# Patient Record
Sex: Male | Born: 1980 | Race: White | Hispanic: Yes | Marital: Single | Smoking: Never smoker
Health system: Southern US, Community
[De-identification: ages and names within clinical notes are randomized; demographics above are authoritative.]

## PROBLEM LIST (undated history)

## (undated) DIAGNOSIS — K219 Gastro-esophageal reflux disease without esophagitis: Secondary | ICD-10-CM

## (undated) DIAGNOSIS — Z972 Presence of dental prosthetic device (complete) (partial): Secondary | ICD-10-CM

## (undated) DIAGNOSIS — D2339 Other benign neoplasm of skin of other parts of face: Secondary | ICD-10-CM

---

## 2014-04-26 ENCOUNTER — Encounter (HOSPITAL_BASED_OUTPATIENT_CLINIC_OR_DEPARTMENT_OTHER): Payer: Self-pay | Admitting: *Deleted

## 2014-04-26 NOTE — Pre-Procedure Instructions (Signed)
Pt's friend, Vista Deck, will interpret for pt. DOS; advised pt. that he will need to sign a release for her to be interpreter.

## 2014-04-30 ENCOUNTER — Ambulatory Visit (HOSPITAL_BASED_OUTPATIENT_CLINIC_OR_DEPARTMENT_OTHER)
Admission: RE | Admit: 2014-04-30 | Discharge: 2014-04-30 | Disposition: A | Discharge status: Home / Self Care | Payer: BC Managed Care – PPO | Source: Ambulatory Visit | Attending: Plastic Surgery | Admitting: Plastic Surgery

## 2014-04-30 ENCOUNTER — Encounter (HOSPITAL_BASED_OUTPATIENT_CLINIC_OR_DEPARTMENT_OTHER): Payer: Self-pay | Admitting: Anesthesiology

## 2014-04-30 ENCOUNTER — Encounter (HOSPITAL_BASED_OUTPATIENT_CLINIC_OR_DEPARTMENT_OTHER): Payer: BC Managed Care – PPO | Admitting: Certified Registered"

## 2014-04-30 ENCOUNTER — Ambulatory Visit (HOSPITAL_BASED_OUTPATIENT_CLINIC_OR_DEPARTMENT_OTHER): Payer: BC Managed Care – PPO | Admitting: Certified Registered"

## 2014-04-30 ENCOUNTER — Encounter (HOSPITAL_BASED_OUTPATIENT_CLINIC_OR_DEPARTMENT_OTHER)
Admission: RE | Disposition: A | Discharge status: Home / Self Care | Payer: Self-pay | Source: Ambulatory Visit | Attending: Plastic Surgery

## 2014-04-30 DIAGNOSIS — L723 Sebaceous cyst: Secondary | ICD-10-CM | POA: Insufficient documentation

## 2014-04-30 DIAGNOSIS — K219 Gastro-esophageal reflux disease without esophagitis: Secondary | ICD-10-CM | POA: Insufficient documentation

## 2014-04-30 HISTORY — PX: LIPOMA EXCISION: SHX5283

## 2014-04-30 HISTORY — DX: Gastro-esophageal reflux disease without esophagitis: K21.9

## 2014-04-30 HISTORY — DX: Presence of dental prosthetic device (complete) (partial): Z97.2

## 2014-04-30 LAB — POCT HEMOGLOBIN-HEMACUE: Hemoglobin: 14.3 g/dL (ref 13.0–17.0)

## 2014-04-30 SURGERY — EXCISION LIPOMA
Anesthesia: General | Site: Face | Laterality: Right

## 2014-04-30 MED ORDER — OXYCODONE HCL 5 MG/5ML PO SOLN
5.0000 mg | Freq: Once | ORAL | Status: DC | PRN
Start: 2014-04-30 — End: 2014-04-30

## 2014-04-30 MED ORDER — ONDANSETRON HCL 4 MG/2ML IJ SOLN
INTRAMUSCULAR | Status: DC | PRN
  Administered 2014-04-30: 4 mg via INTRAVENOUS

## 2014-04-30 MED ORDER — PROMETHAZINE HCL 25 MG/ML IJ SOLN: 6.2500 mg | INTRAMUSCULAR | Status: DC | PRN

## 2014-04-30 MED ORDER — LIDOCAINE HCL (CARDIAC) 20 MG/ML IV SOLN
INTRAVENOUS | Status: DC | PRN
  Administered 2014-04-30: 100 mg via INTRAVENOUS

## 2014-04-30 MED ORDER — FENTANYL CITRATE 0.05 MG/ML IJ SOLN
INTRAMUSCULAR | Status: AC
  Filled 2014-04-30: qty 4

## 2014-04-30 MED ORDER — MIDAZOLAM HCL 2 MG/2ML IJ SOLN: 1.0000 mg | INTRAMUSCULAR | Status: DC | PRN

## 2014-04-30 MED ORDER — LIDOCAINE-EPINEPHRINE 1 %-1:100000 IJ SOLN
INTRAMUSCULAR | Status: DC | PRN
  Administered 2014-04-30: 5.5 mL

## 2014-04-30 MED ORDER — LACTATED RINGERS IV SOLN
INTRAVENOUS | Status: DC
  Administered 2014-04-30: 13:00:00 via INTRAVENOUS

## 2014-04-30 MED ORDER — MIDAZOLAM HCL 5 MG/5ML IJ SOLN
INTRAMUSCULAR | Status: DC | PRN
  Administered 2014-04-30: 2 mg via INTRAVENOUS

## 2014-04-30 MED ORDER — DEXAMETHASONE SODIUM PHOSPHATE 4 MG/ML IJ SOLN
INTRAMUSCULAR | Status: DC | PRN
  Administered 2014-04-30: 10 mg via INTRAVENOUS

## 2014-04-30 MED ORDER — BACITRACIN ZINC 500 UNIT/GM EX OINT
TOPICAL_OINTMENT | CUTANEOUS | Status: AC
  Filled 2014-04-30: qty 0.9

## 2014-04-30 MED ORDER — LIDOCAINE-EPINEPHRINE 1 %-1:100000 IJ SOLN
INTRAMUSCULAR | Status: AC
  Filled 2014-04-30: qty 1

## 2014-04-30 MED ORDER — BSS IO SOLN
INTRAOCULAR | Status: AC
  Filled 2014-04-30: qty 15

## 2014-04-30 MED ORDER — CEFAZOLIN SODIUM 1-5 GM-% IV SOLN
1.0000 g | Freq: Once | INTRAVENOUS | Status: AC
  Administered 2014-04-30: 2 g via INTRAVENOUS

## 2014-04-30 MED ORDER — OXYCODONE HCL 5 MG PO TABS: 5.0000 mg | ORAL_TABLET | Freq: Once | ORAL | Status: DC | PRN

## 2014-04-30 MED ORDER — FENTANYL CITRATE 0.05 MG/ML IJ SOLN
INTRAMUSCULAR | Status: DC | PRN
Start: 2014-04-30 — End: 2014-04-30
  Administered 2014-04-30: 100 ug via INTRAVENOUS

## 2014-04-30 MED ORDER — BACITRACIN 500 UNIT/GM EX OINT
TOPICAL_OINTMENT | CUTANEOUS | Status: DC | PRN
  Administered 2014-04-30: 1 via TOPICAL

## 2014-04-30 MED ORDER — MIDAZOLAM HCL 2 MG/2ML IJ SOLN
INTRAMUSCULAR | Status: AC
  Filled 2014-04-30: qty 2

## 2014-04-30 MED ORDER — FENTANYL CITRATE 0.05 MG/ML IJ SOLN: 50.0000 ug | INTRAMUSCULAR | Status: DC | PRN

## 2014-04-30 MED ORDER — TRIAMCINOLONE ACETONIDE 40 MG/ML IJ SUSP
INTRAMUSCULAR | Status: AC
  Filled 2014-04-30: qty 5

## 2014-04-30 MED ORDER — CEFAZOLIN SODIUM-DEXTROSE 2-3 GM-% IV SOLR
INTRAVENOUS | Status: AC
  Filled 2014-04-30: qty 50

## 2014-04-30 MED ORDER — HYDROMORPHONE HCL PF 1 MG/ML IJ SOLN: 0.2500 mg | INTRAMUSCULAR | Status: DC | PRN

## 2014-04-30 MED ORDER — PROPOFOL 10 MG/ML IV BOLUS
INTRAVENOUS | Status: DC | PRN
  Administered 2014-04-30: 200 mg via INTRAVENOUS

## 2014-04-30 MED ORDER — MIDAZOLAM HCL 2 MG/ML PO SYRP: 12.0000 mg | ORAL_SOLUTION | Freq: Once | ORAL | Status: DC | PRN

## 2014-04-30 SURGICAL SUPPLY — 54 items
BANDAGE ELASTIC 6 VELCRO ST LF (GAUZE/BANDAGES/DRESSINGS) IMPLANT
BENZOIN TINCTURE PRP APPL 2/3 (GAUZE/BANDAGES/DRESSINGS) IMPLANT
BLADE 10 SAFETY STRL DISP (BLADE) IMPLANT
BLADE 15 SAFETY STRL DISP (BLADE) ×6 IMPLANT
BLADE HEX COATED 2.75 (ELECTRODE) ×3 IMPLANT
CANISTER SUCT 1200ML W/VALVE (MISCELLANEOUS) IMPLANT
CLOSURE WOUND 1/2 X4 (GAUZE/BANDAGES/DRESSINGS)
COTTONBALL LRG STERILE PKG (GAUZE/BANDAGES/DRESSINGS) IMPLANT
COVER MAYO STAND STRL (DRAPES) ×3 IMPLANT
COVER SURGICAL LIGHT HANDLE (MISCELLANEOUS) ×3 IMPLANT
COVER TABLE BACK 60X90 (DRAPES) ×3 IMPLANT
DECANTER SPIKE VIAL GLASS SM (MISCELLANEOUS) ×3 IMPLANT
ELECT NEEDLE TIP 2.8 STRL (NEEDLE) IMPLANT
ELECT REM PT RETURN 9FT ADLT (ELECTROSURGICAL) ×3
ELECTRODE REM PT RTRN 9FT ADLT (ELECTROSURGICAL) ×1 IMPLANT
GAUZE SPONGE 4X4 12PLY STRL (GAUZE/BANDAGES/DRESSINGS) IMPLANT
GAUZE SPONGE 4X4 16PLY XRAY LF (GAUZE/BANDAGES/DRESSINGS) IMPLANT
GLOVE BIO SURGEON STRL SZ 6.5 (GLOVE) ×2 IMPLANT
GLOVE BIO SURGEON STRL SZ7 (GLOVE) ×9 IMPLANT
GLOVE BIO SURGEONS STRL SZ 6.5 (GLOVE) ×1
GLOVE SURG SS PI 7.0 STRL IVOR (GLOVE) ×3 IMPLANT
GOWN STRL REUS W/ TWL LRG LVL3 (GOWN DISPOSABLE) ×3 IMPLANT
GOWN STRL REUS W/TWL LRG LVL3 (GOWN DISPOSABLE) ×6
NDL SAFETY ECLIPSE 18X1.5 (NEEDLE) ×1 IMPLANT
NEEDLE HYPO 18GX1.5 SHARP (NEEDLE) ×2
NEEDLE HYPO 25X1 1.5 SAFETY (NEEDLE) ×3 IMPLANT
NEEDLE HYPO 30X.5 LL (NEEDLE) IMPLANT
NEEDLE SPNL 18GX3.5 QUINCKE PK (NEEDLE) IMPLANT
NS IRRIG 1000ML POUR BTL (IV SOLUTION) IMPLANT
PACK BASIN DAY SURGERY FS (CUSTOM PROCEDURE TRAY) ×3 IMPLANT
PAD ALCOHOL SWAB (MISCELLANEOUS) IMPLANT
PEN SKIN MARKING BROAD TIP (MISCELLANEOUS) ×3 IMPLANT
PENCIL BUTTON HOLSTER BLD 10FT (ELECTRODE) ×3 IMPLANT
SPONGE GAUZE 4X4 12PLY STER LF (GAUZE/BANDAGES/DRESSINGS) IMPLANT
STRIP CLOSURE SKIN 1/2X4 (GAUZE/BANDAGES/DRESSINGS) IMPLANT
SUT MNCRL AB 3-0 PS2 18 (SUTURE) IMPLANT
SUT MNCRL AB 4-0 PS2 18 (SUTURE) ×3 IMPLANT
SUT MON AB 2-0 CT1 36 (SUTURE) IMPLANT
SUT MON AB 4-0 PC3 18 (SUTURE) IMPLANT
SUT MON AB 5-0 PS2 18 (SUTURE) IMPLANT
SUT PROLENE 5 0 PS 2 (SUTURE) ×3 IMPLANT
SUT PROLENE 6 0 P 1 18 (SUTURE) IMPLANT
SUT SILK 4 0 PS 2 (SUTURE) IMPLANT
SUT SILK 6 0 P 1 (SUTURE) IMPLANT
SUT VIC AB 5-0 P-3 18X BRD (SUTURE) IMPLANT
SUT VIC AB 5-0 P3 18 (SUTURE)
SYR BULB IRRIGATION 50ML (SYRINGE) IMPLANT
SYR CONTROL 10ML LL (SYRINGE) ×6 IMPLANT
SYR TB 1ML LL NO SAFETY (SYRINGE) ×3 IMPLANT
TOWEL OR 17X24 6PK STRL BLUE (TOWEL DISPOSABLE) ×6 IMPLANT
TRAY DSU PREP LF (CUSTOM PROCEDURE TRAY) ×3 IMPLANT
TUBE CONNECTING 20'X1/4 (TUBING)
TUBE CONNECTING 20X1/4 (TUBING) IMPLANT
YANKAUER SUCT BULB TIP NO VENT (SUCTIONS) IMPLANT

## 2014-04-30 NOTE — Anesthesia Preprocedure Evaluation (Signed)
Anesthesia Evaluation    Reviewed: Allergy & Precautions, H&P , NPO status , Patient's Chart, lab work & pertinent test results  Airway       Dental   Pulmonary neg pulmonary ROS,  breath sounds clear to auscultation        Cardiovascular negative cardio ROS      Neuro/Psych negative neurological ROS  negative psych ROS   GI/Hepatic Neg liver ROS, GERD-  ,  Endo/Other  negative endocrine ROS  Renal/GU negative Renal ROS     Musculoskeletal   Abdominal   Peds  Hematology   Anesthesia Other Findings   Reproductive/Obstetrics negative OB ROS                           Anesthesia Physical Anesthesia Plan  ASA: II  Anesthesia Plan: General LMA   Post-op Pain Management:    Induction:   Airway Management Planned:   Additional Equipment:   Intra-op Plan:   Post-operative Plan:   Informed Consent:   Plan Discussed with: Anesthesiologist, CRNA and Surgeon  Anesthesia Plan Comments:         Anesthesia Quick Evaluation

## 2014-04-30 NOTE — Transfer of Care (Signed)
Immediate Anesthesia Transfer of Care Note  Patient: Richard Blackwell  Procedure(s) Performed: Procedure(s) with comments: EXPLORATION CYST RIGHT EYE BROW (Right) - Right brow  Patient Location: PACU  Anesthesia Type:General  Level of Consciousness: awake and sedated  Airway & Oxygen Therapy: Patient Spontanous Breathing and Patient connected to face mask oxygen  Post-op Assessment: Report given to PACU RN and Post -op Vital signs reviewed and stable  Post vital signs: Reviewed and stable  Complications: No apparent anesthesia complications

## 2014-04-30 NOTE — Anesthesia Procedure Notes (Signed)
Procedure Name: LMA Insertion Performed by: Terrance Mass Pre-anesthesia Checklist: Patient identified, Emergency Drugs available, Suction available, Patient being monitored and Timeout performed Patient Re-evaluated:Patient Re-evaluated prior to inductionOxygen Delivery Method: Circle system utilized Preoxygenation: Pre-oxygenation with 100% oxygen Intubation Type: IV induction Ventilation: Mask ventilation without difficulty LMA: LMA inserted LMA Size: 4.0 Number of attempts: 1 Placement Confirmation: breath sounds checked- equal and bilateral and positive ETCO2 Tube secured with: Tape Dental Injury: Teeth and Oropharynx as per pre-operative assessment

## 2014-04-30 NOTE — H&P (Signed)
  H&P faxed to surgical center.  -History and Physical Reviewed  -Patient has been re-examined  -No change in the plan of care  Richard Blackwell    

## 2014-04-30 NOTE — Discharge Instructions (Signed)
1. Keep dressing clean and dry until tomorrow.Then may remove and begin cleaning incision with Q-tip and water three times a day then apply Bacitracin Ointment.     May shower once dressing is removed. Do not scrub area or use soap. Allow water to run over incision and then pat dry. 2. Keep head elevated. 3. Take antibiotic (Keflex 500 mgs. four times a day for three days). 4. Apply ice to right eyebrow as needed for swelling. 5. Follow-up appointment Monday in office.  Call your surgeon if you experience:   1.  Fever over 101.0. 2.  Inability to urinate. 3.  Nausea and/or vomiting. 4.  Extreme swelling or bruising at the surgical site. 5.  Continued bleeding from the incision. 6.  Increased pain, redness or drainage from the incision. 7.  Problems related to your pain medication.   Post Anesthesia Home Care Instructions  Activity: Get plenty of rest for the remainder of the day. A responsible adult should stay with you for 24 hours following the procedure.  For the next 24 hours, DO NOT: -Drive a car -Paediatric nurse -Drink alcoholic beverages -Take any medication unless instructed by your physician -Make any legal decisions or sign important papers.  Meals: Start with liquid foods such as gelatin or soup. Progress to regular foods as tolerated. Avoid greasy, spicy, heavy foods. If nausea and/or vomiting occur, drink only clear liquids until the nausea and/or vomiting subsides. Call your physician if vomiting continues.  Special Instructions/Symptoms: Your throat may feel dry or sore from the anesthesia or the breathing tube placed in your throat during surgery. If this causes discomfort, gargle with warm salt water. The discomfort should disappear within 24 hours.

## 2014-04-30 NOTE — Brief Op Note (Signed)
04/30/2014  3:20 PM  PATIENT:  Richard Blackwell  33 y.o. male  PRE-OPERATIVE DIAGNOSIS:  LIPOMA right eyebrow  POST-OPERATIVE DIAGNOSIS:  LIPOMA right eyebrow  PROCEDURE:  Procedure(s) with comments: EXPLORATION CYST RIGHT EYE BROW (Right) - Right brow  SURGEON:  Surgeon(s) and Role:    * Charlene Brooke, MD - Primary  ANESTHESIA:   general  EBL:  Total I/O In: 1200 [I.V.:1200] Out: -   BLOOD ADMINISTERED:none  DRAINS: none   LOCAL MEDICATIONS USED:  LIDOCAINE   SPECIMEN:  Source of Specimen:  Right Eyebrow  DISPOSITION OF SPECIMEN:  PATHOLOGY  COUNTS:  YES  DICTATION: .Other Dictation: Dictation Number [id number]  PLAN OF CARE: Discharge to home after PACU  PATIENT DISPOSITION:  PACU - hemodynamically stable.   Delay start of Pharmacological VTE agent (>24hrs) due to surgical blood loss or risk of bleeding: not applicable

## 2014-04-30 NOTE — Anesthesia Postprocedure Evaluation (Signed)
Anesthesia Post Note  Patient: Richard Blackwell  Procedure(s) Performed: Procedure(s) (LRB): EXPLORATION CYST RIGHT EYE BROW (Right)  Anesthesia type: general  Patient location: PACU  Post pain: Pain level controlled  Post assessment: Patient's Cardiovascular Status Stable  Last Vitals:  Filed Vitals:   04/30/14 1615  BP: 123/56  Pulse: 78  Temp:   Resp: 14    Post vital signs: Reviewed and stable  Level of consciousness: sedated  Complications: No apparent anesthesia complications

## 2014-05-03 ENCOUNTER — Encounter (HOSPITAL_BASED_OUTPATIENT_CLINIC_OR_DEPARTMENT_OTHER): Payer: Self-pay | Admitting: Plastic Surgery

## 2014-05-04 NOTE — Addendum Note (Signed)
Addendum created 05/04/14 1829 by Duane Boston, MD   Modules edited: Anesthesia Attestations

## 2014-06-11 NOTE — Op Note (Signed)
NAMEZECHARIAH, Blackwell                ACCOUNT NO.:  [id number]  MEDICAL RECORD NO.:  [id number]  LOCATION:                                FACILITY:  [facility]  PHYSICIAN:  Hetty Blend, M.D.DATE OF BIRTH:  1981/05/30  DATE OF PROCEDURE:  04/30/2014 DATE OF DISCHARGE:  04/30/2014                              OPERATIVE REPORT   PREOPERATIVE DIAGNOSIS:  Lipoma versus cyst of right lateral eyebrow.  POSTOPERATIVE DIAGNOSIS:  4.0 cm cyst of right lateral eyebrow.  PROCEDURE: 1. Complex closure of 3.0 cm incision, right lateral eyebrow. 2. Excision of 4.0 cm cyst, right lateral eyebrow.  ATTENDING SURGEON:  Hetty Blend, M.D.  ANESTHESIA:  General.  ANESTHESIOLOGIST:  Nelda Severe. Tobias Alexander, M.D.  COMPLICATIONS:  None.  INDICATIONS FOR THE PROCEDURE:  The patient is a 33 year old Hispanic male who has had a soft tissue mass present along the right lateral eyebrow for several years.  He presents to undergo excision of the lesion.  PROCEDURE:  The patient was marked in the preop holding area for excision of the right lateral eyebrow mass.  He was then taken to the OR, placed on the table in supine position.  After adequate general anesthesia was obtained, the patient's face and scalp were prepped with Betadine and draped in sterile fashion.  The soft tissue mass was easily identified.  The skin and soft tissues around the mass were infiltrated with 1% lidocaine with epinephrine.  After adequate hemostasis and anesthesia taken effect, the procedure was begun.  Using loupe magnification, the skin over the soft tissue mass was examined.  There did not appear to be a clinical skin pore involved with the mass.  I therefore made an incision, running parallel and adjacent to the eyebrow hair laterally but to be hidden within the hairline.  The subcutaneous tissues were entered.  However, the mass then appeared to be present below the muscle layer.  The muscle layer was thus incised along  the fibers of the muscle and then gently spread to reveal the soft tissue structure.  At this point, the structure did appear to be cystic in nature rather than a lipoma which would have been the other possible diagnosis.  As the cyst was being excised from the surrounding tissues, the cyst was entered with the blunt-tipped scissors.  Upon entering the cyst, it was apparent that there was an oily yellow fluid present.  A sample of this fluid was taken and sent off to pathology for evaluation. Also a sample of the cyst lining was sent to pathology as a specimen. In talking to the pathology lab, they were not going to be able to give an intraoperative diagnosis, however, they would review the specimen afterwards for final pathologic section evaluation.  The cyst was thus drained and part of its lining removed.  This appeared to adequately treat the cyst.  The cystic structure did not appear to go through the level of the skull bone.  Meticulous hemostasis was obtained.  The incision was then closed in a complex fashion.  The muscular layer was closed with 3-0 Monocryl suture.  The deeper subcutaneous tissues were also closed with 3-0 Monocryl suture.  The  dermal layer was then closed with 3-0 Monocryl suture.  The skin was then closed with a 4-0 Monocryl in a running intracuticular stitch.  Total length of this excision was 4.0 cm.  Total length of complex closure of incision was 3.0 cm.  The incision was then dressed with bacitracin ointment, Xeroform, and a small pressure dressing.  The patient will keep the pressure dressing on overnight and then can remove it tomorrow.  There were no complications. The patient tolerated the procedure well.  The needle sponge counts were reported to be correct at the end of the case.  The patient was also recovered without complications.  Both the patient and his girlfriend were given proper postoperative wound care instructions. The patient was then  discharged in the care of his girlfriend in stable condition.  Followup appointment will be on Monday in the office.          ______________________________ Hetty Blend, M.D.     [facility]  D:  06/10/2014  T:  06/10/2014  Job:  [id number]

## 2014-06-26 DIAGNOSIS — D2339 Other benign neoplasm of skin of other parts of face: Secondary | ICD-10-CM

## 2014-06-26 HISTORY — DX: Other benign neoplasm of skin of other parts of face: D23.39

## 2014-07-01 ENCOUNTER — Other Ambulatory Visit: Payer: Self-pay | Admitting: Plastic Surgery

## 2014-07-01 ENCOUNTER — Ambulatory Visit
Admission: RE | Admit: 2014-07-01 | Discharge: 2014-07-01 | Disposition: A | Discharge status: Home / Self Care | Payer: BC Managed Care – PPO | Source: Ambulatory Visit | Attending: Plastic Surgery | Admitting: Plastic Surgery

## 2014-07-01 DIAGNOSIS — C760 Malignant neoplasm of head, face and neck: Secondary | ICD-10-CM

## 2014-07-01 DIAGNOSIS — L723 Sebaceous cyst: Secondary | ICD-10-CM

## 2014-07-01 MED ORDER — IOHEXOL 300 MG/ML  SOLN
75.0000 mL | Freq: Once | INTRAMUSCULAR | Status: AC | PRN
  Administered 2014-07-01: 75 mL via INTRAVENOUS

## 2014-07-02 ENCOUNTER — Encounter (HOSPITAL_BASED_OUTPATIENT_CLINIC_OR_DEPARTMENT_OTHER): Payer: Self-pay | Admitting: *Deleted

## 2014-07-02 NOTE — Pre-Procedure Instructions (Signed)
Girlfriend, Jaci Lazier, will be interpreter for pt.

## 2014-07-05 ENCOUNTER — Ambulatory Visit (HOSPITAL_BASED_OUTPATIENT_CLINIC_OR_DEPARTMENT_OTHER)
Admission: RE | Admit: 2014-07-05 | Discharge: 2014-07-05 | Disposition: A | Discharge status: Home / Self Care | Payer: BC Managed Care – PPO | Source: Ambulatory Visit | Attending: Plastic Surgery | Admitting: Plastic Surgery

## 2014-07-05 ENCOUNTER — Encounter (HOSPITAL_BASED_OUTPATIENT_CLINIC_OR_DEPARTMENT_OTHER): Payer: Self-pay | Admitting: Anesthesiology

## 2014-07-05 ENCOUNTER — Encounter (HOSPITAL_BASED_OUTPATIENT_CLINIC_OR_DEPARTMENT_OTHER)
Admission: RE | Disposition: A | Discharge status: Home / Self Care | Payer: Self-pay | Source: Ambulatory Visit | Attending: Plastic Surgery

## 2014-07-05 ENCOUNTER — Ambulatory Visit (HOSPITAL_BASED_OUTPATIENT_CLINIC_OR_DEPARTMENT_OTHER): Payer: BC Managed Care – PPO | Admitting: Anesthesiology

## 2014-07-05 ENCOUNTER — Encounter (HOSPITAL_BASED_OUTPATIENT_CLINIC_OR_DEPARTMENT_OTHER): Payer: BC Managed Care – PPO | Admitting: Anesthesiology

## 2014-07-05 DIAGNOSIS — Y9210 Unspecified residential institution as the place of occurrence of the external cause: Secondary | ICD-10-CM | POA: Diagnosis not present

## 2014-07-05 DIAGNOSIS — Z9119 Patient's noncompliance with other medical treatment and regimen: Secondary | ICD-10-CM | POA: Diagnosis not present

## 2014-07-05 DIAGNOSIS — Y838 Other surgical procedures as the cause of abnormal reaction of the patient, or of later complication, without mention of misadventure at the time of the procedure: Secondary | ICD-10-CM | POA: Diagnosis not present

## 2014-07-05 DIAGNOSIS — Z91199 Patient's noncompliance with other medical treatment and regimen due to unspecified reason: Secondary | ICD-10-CM | POA: Insufficient documentation

## 2014-07-05 DIAGNOSIS — T8140XA Infection following a procedure, unspecified, initial encounter: Secondary | ICD-10-CM | POA: Diagnosis not present

## 2014-07-05 HISTORY — DX: Other benign neoplasm of skin of other parts of face: D23.39

## 2014-07-05 HISTORY — PX: EAR CYST EXCISION: SHX22

## 2014-07-05 SURGERY — CYST REMOVAL
Anesthesia: General | Site: Eye | Laterality: Right

## 2014-07-05 MED ORDER — LIDOCAINE-EPINEPHRINE 1 %-1:100000 IJ SOLN
INTRAMUSCULAR | Status: AC
  Filled 2014-07-05: qty 1

## 2014-07-05 MED ORDER — OXYCODONE HCL 5 MG PO TABS: 5.0000 mg | ORAL_TABLET | Freq: Once | ORAL | Status: DC | PRN

## 2014-07-05 MED ORDER — MIDAZOLAM HCL 5 MG/5ML IJ SOLN
INTRAMUSCULAR | Status: DC | PRN
  Administered 2014-07-05: 2 mg via INTRAVENOUS

## 2014-07-05 MED ORDER — ONDANSETRON HCL 4 MG/2ML IJ SOLN
INTRAMUSCULAR | Status: DC | PRN
  Administered 2014-07-05: 4 mg via INTRAVENOUS

## 2014-07-05 MED ORDER — FENTANYL CITRATE 0.05 MG/ML IJ SOLN
INTRAMUSCULAR | Status: AC
  Filled 2014-07-05: qty 4

## 2014-07-05 MED ORDER — MIDAZOLAM HCL 2 MG/ML PO SYRP: 12.0000 mg | ORAL_SOLUTION | Freq: Once | ORAL | Status: DC | PRN

## 2014-07-05 MED ORDER — LIDOCAINE HCL (CARDIAC) 20 MG/ML IV SOLN
INTRAVENOUS | Status: DC | PRN
  Administered 2014-07-05: 50 mg via INTRAVENOUS

## 2014-07-05 MED ORDER — PROPOFOL 10 MG/ML IV BOLUS
INTRAVENOUS | Status: DC | PRN
  Administered 2014-07-05: 200 mg via INTRAVENOUS

## 2014-07-05 MED ORDER — MIDAZOLAM HCL 2 MG/2ML IJ SOLN: 1.0000 mg | INTRAMUSCULAR | Status: DC | PRN

## 2014-07-05 MED ORDER — MIDAZOLAM HCL 2 MG/2ML IJ SOLN
INTRAMUSCULAR | Status: AC
  Filled 2014-07-05: qty 2

## 2014-07-05 MED ORDER — LIDOCAINE-EPINEPHRINE 0.5 %-1:200000 IJ SOLN
INTRAMUSCULAR | Status: AC
  Filled 2014-07-05: qty 1

## 2014-07-05 MED ORDER — OXYCODONE HCL 5 MG/5ML PO SOLN: 5.0000 mg | Freq: Once | ORAL | Status: DC | PRN

## 2014-07-05 MED ORDER — FENTANYL CITRATE 0.05 MG/ML IJ SOLN: 50.0000 ug | INTRAMUSCULAR | Status: DC | PRN

## 2014-07-05 MED ORDER — FENTANYL CITRATE 0.05 MG/ML IJ SOLN
INTRAMUSCULAR | Status: DC | PRN
  Administered 2014-07-05: 100 ug via INTRAVENOUS

## 2014-07-05 MED ORDER — DEXAMETHASONE SODIUM PHOSPHATE 4 MG/ML IJ SOLN
INTRAMUSCULAR | Status: DC | PRN
  Administered 2014-07-05: 10 mg via INTRAVENOUS

## 2014-07-05 MED ORDER — EPHEDRINE SULFATE 50 MG/ML IJ SOLN
INTRAMUSCULAR | Status: DC | PRN
  Administered 2014-07-05: 10 mg via INTRAVENOUS

## 2014-07-05 MED ORDER — LIDOCAINE-EPINEPHRINE 1 %-1:100000 IJ SOLN
INTRAMUSCULAR | Status: DC | PRN
  Administered 2014-07-05: 2 mL

## 2014-07-05 MED ORDER — HYDROMORPHONE HCL PF 1 MG/ML IJ SOLN: 0.2500 mg | INTRAMUSCULAR | Status: DC | PRN

## 2014-07-05 MED ORDER — LACTATED RINGERS IV SOLN
INTRAVENOUS | Status: DC
  Administered 2014-07-05 (×2): via INTRAVENOUS

## 2014-07-05 MED ORDER — CEFAZOLIN SODIUM-DEXTROSE 2-3 GM-% IV SOLR
INTRAVENOUS | Status: AC
  Filled 2014-07-05: qty 50

## 2014-07-05 MED ORDER — TRIAMCINOLONE ACETONIDE 40 MG/ML IJ SUSP
INTRAMUSCULAR | Status: AC
  Filled 2014-07-05: qty 5

## 2014-07-05 MED ORDER — CEFAZOLIN SODIUM 1-5 GM-% IV SOLN
1.0000 g | Freq: Once | INTRAVENOUS | Status: AC
  Administered 2014-07-05: 2 g via INTRAVENOUS

## 2014-07-05 SURGICAL SUPPLY — 55 items
BANDAGE ELASTIC 6 VELCRO ST LF (GAUZE/BANDAGES/DRESSINGS) IMPLANT
BANDAGE EYE OVAL (MISCELLANEOUS) ×12 IMPLANT
BENZOIN TINCTURE PRP APPL 2/3 (GAUZE/BANDAGES/DRESSINGS) IMPLANT
BLADE HEX COATED 2.75 (ELECTRODE) ×3 IMPLANT
BLADE SURG 10 STRL SS (BLADE) IMPLANT
BLADE SURG 15 STRL LF DISP TIS (BLADE) ×1 IMPLANT
BLADE SURG 15 STRL SS (BLADE) ×2
CANISTER SUCT 1200ML W/VALVE (MISCELLANEOUS) IMPLANT
CLOSURE WOUND 1/2 X4 (GAUZE/BANDAGES/DRESSINGS)
COTTONBALL LRG STERILE PKG (GAUZE/BANDAGES/DRESSINGS) IMPLANT
COVER MAYO STAND STRL (DRAPES) ×3 IMPLANT
COVER TABLE BACK 60X90 (DRAPES) ×3 IMPLANT
DECANTER SPIKE VIAL GLASS SM (MISCELLANEOUS) ×3 IMPLANT
ELECT NEEDLE TIP 2.8 STRL (NEEDLE) IMPLANT
ELECT REM PT RETURN 9FT ADLT (ELECTROSURGICAL) ×3
ELECTRODE REM PT RTRN 9FT ADLT (ELECTROSURGICAL) ×1 IMPLANT
GAUZE SPONGE 4X4 16PLY XRAY LF (GAUZE/BANDAGES/DRESSINGS) IMPLANT
GLOVE BIO SURGEON STRL SZ 6.5 (GLOVE) ×2 IMPLANT
GLOVE BIO SURGEON STRL SZ7 (GLOVE) ×3 IMPLANT
GLOVE BIO SURGEON STRL SZ7.5 (GLOVE) ×3 IMPLANT
GLOVE BIO SURGEONS STRL SZ 6.5 (GLOVE) ×1
GLOVE ECLIPSE 6.5 STRL STRAW (GLOVE) ×3 IMPLANT
GOWN STRL REUS W/ TWL LRG LVL3 (GOWN DISPOSABLE) ×1 IMPLANT
GOWN STRL REUS W/TWL LRG LVL3 (GOWN DISPOSABLE) ×2
NEEDLE HYPO 25X1 1.5 SAFETY (NEEDLE) IMPLANT
NEEDLE HYPO 30X.5 LL (NEEDLE) IMPLANT
NEEDLE SPNL 18GX3.5 QUINCKE PK (NEEDLE) IMPLANT
NS IRRIG 1000ML POUR BTL (IV SOLUTION) IMPLANT
PACK BASIN DAY SURGERY FS (CUSTOM PROCEDURE TRAY) ×3 IMPLANT
PAD ALCOHOL SWAB (MISCELLANEOUS) IMPLANT
PEN SKIN MARKING BROAD TIP (MISCELLANEOUS) IMPLANT
PENCIL BUTTON HOLSTER BLD 10FT (ELECTRODE) ×3 IMPLANT
SHEET MEDIUM DRAPE 40X70 STRL (DRAPES) ×3 IMPLANT
SPONGE GAUZE 2X2 8PLY STER LF (GAUZE/BANDAGES/DRESSINGS) ×1
SPONGE GAUZE 2X2 8PLY STRL LF (GAUZE/BANDAGES/DRESSINGS) ×2 IMPLANT
SPONGE GAUZE 4X4 12PLY STER LF (GAUZE/BANDAGES/DRESSINGS) IMPLANT
STRIP CLOSURE SKIN 1/2X4 (GAUZE/BANDAGES/DRESSINGS) IMPLANT
SUT MNCRL AB 3-0 PS2 18 (SUTURE) IMPLANT
SUT MNCRL AB 4-0 PS2 18 (SUTURE) IMPLANT
SUT MON AB 4-0 PC3 18 (SUTURE) IMPLANT
SUT MON AB 5-0 PS2 18 (SUTURE) IMPLANT
SUT PROLENE 5 0 PS 2 (SUTURE) IMPLANT
SUT PROLENE 6 0 P 1 18 (SUTURE) IMPLANT
SUT SILK 4 0 PS 2 (SUTURE) IMPLANT
SUT VIC AB 5-0 P-3 18X BRD (SUTURE) IMPLANT
SUT VIC AB 5-0 P3 18 (SUTURE)
SWAB COLLECTION DEVICE MRSA (MISCELLANEOUS) ×3 IMPLANT
SYR 3ML 23GX1 SAFETY (SYRINGE) ×3 IMPLANT
SYR BULB IRRIGATION 50ML (SYRINGE) IMPLANT
SYR CONTROL 10ML LL (SYRINGE) ×3 IMPLANT
TOWEL OR 17X24 6PK STRL BLUE (TOWEL DISPOSABLE) ×6 IMPLANT
TUBE ANAEROBIC SPECIMEN COL (MISCELLANEOUS) ×3 IMPLANT
TUBE CONNECTING 20'X1/4 (TUBING)
TUBE CONNECTING 20X1/4 (TUBING) IMPLANT
YANKAUER SUCT BULB TIP NO VENT (SUCTIONS) IMPLANT

## 2014-07-05 NOTE — Anesthesia Preprocedure Evaluation (Signed)
Anesthesia Evaluation  Patient identified by MRN, date of birth, ID band Patient awake    Reviewed: Allergy & Precautions, H&P , NPO status , Patient's Chart, lab work & pertinent test results  Airway Mallampati: I TM Distance: >3 FB Neck ROM: Full    Dental no notable dental hx. (+) Teeth Intact, Dental Advisory Given   Pulmonary neg pulmonary ROS,  breath sounds clear to auscultation  Pulmonary exam normal       Cardiovascular negative cardio ROS  Rhythm:Regular Rate:Normal     Neuro/Psych negative neurological ROS  negative psych ROS   GI/Hepatic Neg liver ROS, GERD-  Controlled,  Endo/Other  negative endocrine ROS  Renal/GU negative Renal ROS  negative genitourinary   Musculoskeletal   Abdominal   Peds  Hematology negative hematology ROS (+)   Anesthesia Other Findings   Reproductive/Obstetrics negative OB ROS                           Anesthesia Physical Anesthesia Plan  ASA: II  Anesthesia Plan: General   Post-op Pain Management:    Induction: Intravenous  Airway Management Planned: LMA  Additional Equipment:   Intra-op Plan:   Post-operative Plan: Extubation in OR  Informed Consent: I have reviewed the patients History and Physical, chart, labs and discussed the procedure including the risks, benefits and alternatives for the proposed anesthesia with the patient or authorized representative who has indicated his/her understanding and acceptance.   Dental advisory given  Plan Discussed with: CRNA  Anesthesia Plan Comments:         Anesthesia Quick Evaluation

## 2014-07-05 NOTE — Discharge Instructions (Signed)
1. No lifting greater than 5 lbs or bending and stooping of head. 2. Augmentin 875 mg. Tablets- take one tablet p.o. twice a day. Patient already has medication. 3. Keep dressing intact until follow-up appointment on Wednesday- reinforce gauze dressing if needed. 4. Follow-up appointment Wednesday at 10:45am in office.     Post Anesthesia Home Care Instructions  Activity: Get plenty of rest for the remainder of the day. A responsible adult should stay with you for 24 hours following the procedure.  For the next 24 hours, DO NOT: -Drive a car -Paediatric nurse -Drink alcoholic beverages -Take any medication unless instructed by your physician -Make any legal decisions or sign important papers.  Meals: Start with liquid foods such as gelatin or soup. Progress to regular foods as tolerated. Avoid greasy, spicy, heavy foods. If nausea and/or vomiting occur, drink only clear liquids until the nausea and/or vomiting subsides. Call your physician if vomiting continues.  Special Instructions/Symptoms: Your throat may feel dry or sore from the anesthesia or the breathing tube placed in your throat during surgery. If this causes discomfort, gargle with warm salt water. The discomfort should disappear within 24 hours.

## 2014-07-05 NOTE — H&P (Signed)
  H&P faxed to surgical center.  -History and Physical Reviewed  -Patient has been re-examined  -No change in the plan of care  Richard Blackwell A    

## 2014-07-05 NOTE — Transfer of Care (Signed)
Immediate Anesthesia Transfer of Care Note  Patient: Richard Blackwell  Procedure(s) Performed: Procedure(s): EXPLORATION OF RIGHT LATERAL EYE BROW CYST (Right)  Patient Location: PACU  Anesthesia Type:General  Level of Consciousness: awake and patient cooperative  Airway & Oxygen Therapy: Patient Spontanous Breathing and Patient connected to face mask oxygen  Post-op Assessment: Report given to PACU RN and Post -op Vital signs reviewed and stable  Post vital signs: Reviewed and stable  Complications: No apparent anesthesia complications

## 2014-07-05 NOTE — Op Note (Signed)
Richard Blackwell, Richard Blackwell                ACCOUNT NO.:  [id number]  MEDICAL RECORD NO.:  [id number]  LOCATION:                                 FACILITY:  PHYSICIAN:  Hetty Blend, M.D.DATE OF BIRTH:  03/01/1981  DATE OF PROCEDURE:  07/05/2014 DATE OF DISCHARGE:                              OPERATIVE REPORT   PREOPERATIVE DIAGNOSIS:  Probable right lateral eyebrow recurrent cyst with possible wound infection.  POSTOPERATIVE DIAGNOSIS:  Right lateral eyebrow wound infection.  PROCEDURE:  Exploration of right lateral eyebrow postoperative wound and drainage of infection.  ATTENDING SURGEON:  Hetty Blend, M.D.  ANESTHESIA:  General.  COMPLICATIONS:  None.  INDICATIONS FOR PROCEDURE:  The patient is a 33 year old Hispanic male who underwent excision of a cyst at the right lateral eyebrow area on April 30, 2014.  Intraoperatively, it appeared to be a cyst filled with an oily fluid.  The patient did not keep his postoperative wound care appointments in our office and was noncompliant with that.  However, he presented last week to the office with a history of a just a few days to a week's worth of increased swelling in the right lateral eyebrow.  At that time, I suspected that he may have a recurrent cyst in the area.  He did not have any signs of infection present including no erythema or purulent drainage or fever.  I ordered a head and facial CT for the patient to further evaluate the cyst.  I want to be sure there is no intracranial communication and there was not more complexity involved with the surrounding soft tissues.  The CT scan revealed that the cyst was submuscular, but did not communicate with the intracranial cavity. It did look as if there was some organization of the soft tissues with inflammatory change, however, there was no abscess identified.  As a precaution, I placed the patient on p.o. Augmentin 875 mg tablets 1 p.o. b.i.d. at that time, when I got the  report on Friday.  Today in the preop area, it  does appear that he may have a small localized infection at the right lateral brow area.  The skin is reddened, but the patient still has not had a fever.  It is mildly tender to palpation.  We will proceed with exploration of the right eyebrow wound at this time in the OR.  DESCRIPTION OF PROCEDURE:  The patient was marked in the preop holding area and taken back into the OR and general anesthesia was induced.  He was laid in supine position with his head turned to the left.  The skin and subcutaneous tissues in the area of the right lateral eyebrow were infiltrated with 1% lidocaine with epinephrine.  After adequate hemostasis and anesthesia taken effect, the procedure was begun.  Using loupe magnification, the previous incision used was once again incised.  The subcutaneous tissues were entered and it did appear that there was infection present.  Both aerobic and anaerobic cultures were taken intraoperatively and these will be sent off for culture and Gram stain as well as sensitivities.  The wound was then carefully explored and any purulence was removed.  The wound was irrigated  with saline irrigation.  There did appear to be some oily fluid that was present and this was drained as well.  Due to the presence of infection in the soft tissues, I did not want to try to excise the cyst again.  I had explained this pre-opertively to the patient and his girlfriend and they understand that if infection is present then I will only drain the infection and we will have to let him heal from the infection in order to come back and then address the possible recurrences.  The wound was packed with 0.25 inch iodoform packing.  It was then covered with a 2 x 2 and an eyepatch for a pressure dressing.  There were no complications. The patient tolerated the procedure well.  The final needle, sponge counts reported to be correct at the end of the  case.  The patient was also recovered without complications.  Both the patient and his girlfriend were given proper postoperative wound care instructions.  We will plan to leave the dressing intact until Wednesday.  He may drain some from it, but they just need to reinforce the gauze dressing it if he does.  When I will  see him in the office on Wednesday, I may then start to slowly pull out the packing.  We will just have to judge how it is at that point.  The patient was then discharged home in the care of his girlfriend in stable condition.  Followup appointment will be on Wednesday in the office.          ______________________________ Hetty Blend, M.D.     [facility]  D:  07/05/2014  T:  07/05/2014  Job:  [id number]

## 2014-07-05 NOTE — Anesthesia Postprocedure Evaluation (Signed)
  Anesthesia Post-op Note  Patient: Richard Blackwell  Procedure(s) Performed: Procedure(s): EXPLORATION OF RIGHT LATERAL EYE BROW CYST (Right)  Patient Location: PACU  Anesthesia Type:General  Level of Consciousness: awake and alert   Airway and Oxygen Therapy: Patient Spontanous Breathing  Post-op Pain: none  Post-op Assessment: Post-op Vital signs reviewed, Patient's Cardiovascular Status Stable and Respiratory Function Stable  Post-op Vital Signs: Reviewed  Filed Vitals:   07/05/14 1400  BP:   Pulse: 65  Temp:   Resp: 16    Complications: No apparent anesthesia complications

## 2014-07-05 NOTE — Brief Op Note (Signed)
07/05/2014  1:21 PM  PATIENT:  Osamu Olguin  33 y.o. male  PRE-OPERATIVE DIAGNOSIS:  EYE CYST  POST-OPERATIVE DIAGNOSIS:  EYE CYST--RIGHT LARTERAL EYEBROW  PROCEDURE:  Procedure(s): EXPLORATION OF RIGHT LATERAL EYE BROW CYST (Right) and Drainage of Wound Infection  SURGEON:  Surgeon(s) and Role:    * Charlene Brooke, MD - Primary  ANESTHESIA:   general  EBL:  Total I/O In: 1300 [I.V.:1300] Out: -   BLOOD ADMINISTERED:none  LOCAL MEDICATIONS USED:  LIDOCAINE   SPECIMEN:  Source of Specimen:  Right lateral eyebrow  DISPOSITION OF SPECIMEN:  PATHOLOGY  COUNTS:  YES  DICTATION: .Other Dictation: Dictation Number [id number]  PLAN OF CARE: Discharge to home after PACU  PATIENT DISPOSITION:  PACU - hemodynamically stable.   Delay start of Pharmacological VTE agent (>24hrs) due to surgical blood loss or risk of bleeding: not applicable

## 2014-07-05 NOTE — Anesthesia Procedure Notes (Signed)
Procedure Name: LMA Insertion Date/Time: 07/05/2014 12:20 PM Performed by: Lieutenant Diego Pre-anesthesia Checklist: Patient identified, Emergency Drugs available, Suction available and Patient being monitored Patient Re-evaluated:Patient Re-evaluated prior to inductionOxygen Delivery Method: Circle System Utilized Preoxygenation: Pre-oxygenation with 100% oxygen Intubation Type: IV induction Ventilation: Mask ventilation without difficulty LMA: LMA inserted LMA Size: 5.0 Number of attempts: 1 Airway Equipment and Method: bite block Placement Confirmation: positive ETCO2 and breath sounds checked- equal and bilateral Tube secured with: Tape Dental Injury: Teeth and Oropharynx as per pre-operative assessment

## 2014-07-07 ENCOUNTER — Encounter (HOSPITAL_BASED_OUTPATIENT_CLINIC_OR_DEPARTMENT_OTHER): Payer: Self-pay | Admitting: Plastic Surgery

## 2014-07-07 LAB — WOUND CULTURE

## 2014-07-10 LAB — ANAEROBIC CULTURE

## 2014-10-11 IMAGING — CT CT MAXILLOFACIAL W/ CM
3 of 4 series · 16 of 30 positions shown, 18 images · IV contrast (75CC OMNI 300)
Comparison: None.

CLINICAL DATA: Oil cyst removed from right lateral eyebrow
04/30/2014. Enlarging and painful cyst/mass at site of cyst removal.

EXAM:
CT MAXILLOFACIAL WITH CONTRAST
TECHNIQUE: Multidetector CT imaging of the maxillofacial structures was
performed with intravenous contrast. Multiplanar CT image
reconstructions were also generated. A small metallic BB was placed
on the right temple in order to reliably differentiate right from
left.
CONTRAST:  75mL OMNIPAQUE IOHEXOL 300 MG/ML  SOLN

[Series 2: max soft · axial · 0.38mm/px · z∈[-61,+44]mm · 4 of 71 slices shown]
[im 15/71  brain]
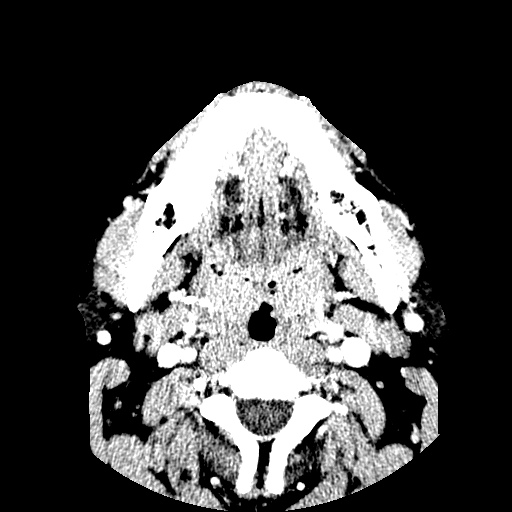
[im 29/71  brain]
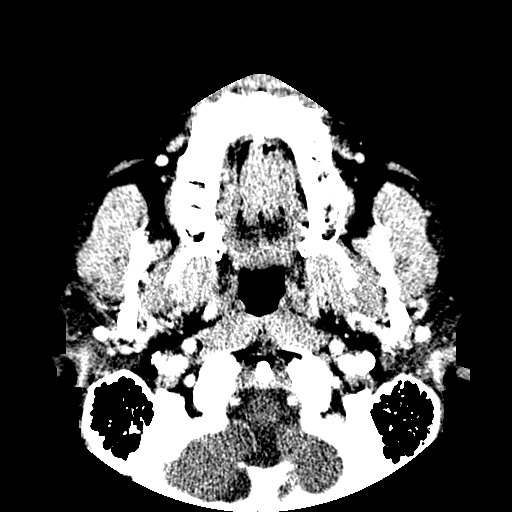
[im 43/71  brain]
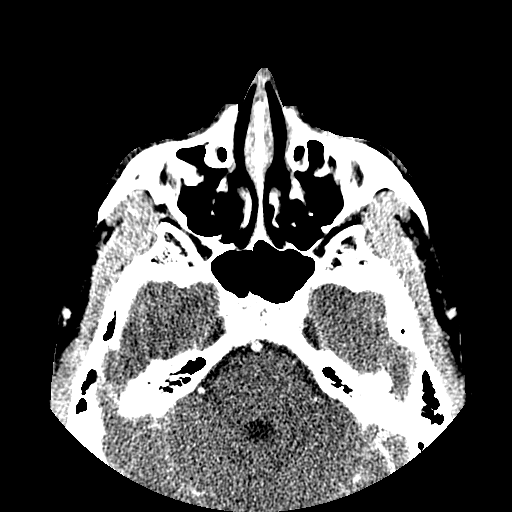
[im 57/71  brain]
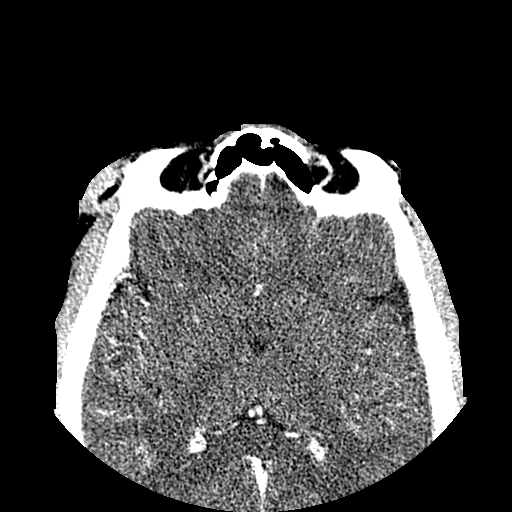

[Series 3: max bone · axial · 0.38mm/px · z∈[-68,+49]mm · 5 of 71 slices shown, 7 images]
[im 12/71  brain]
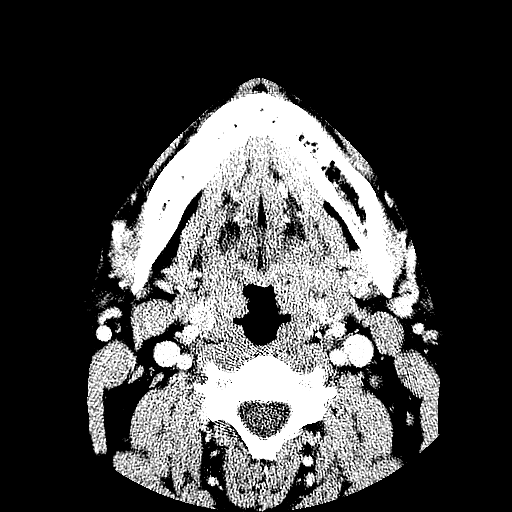
[im 12/71  bone]
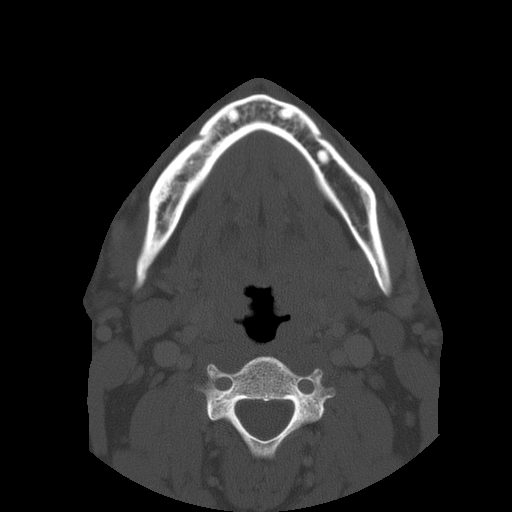
[im 24/71  bone]
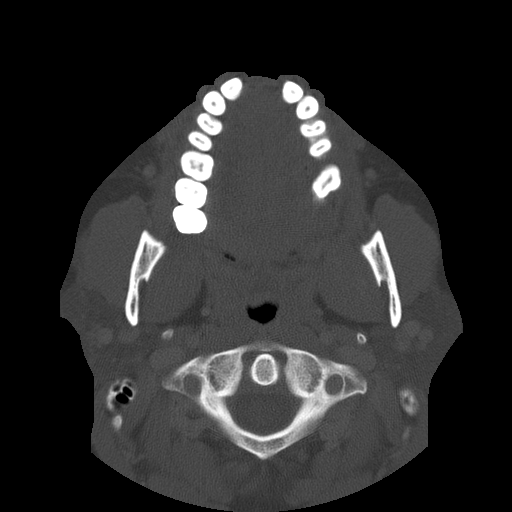
[im 36/71  bone]
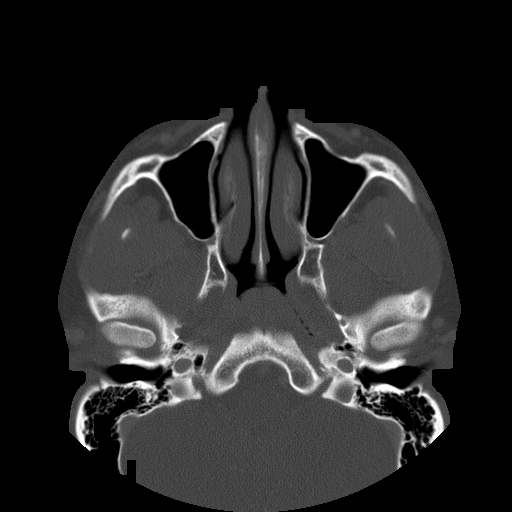
[im 47/71  bone]
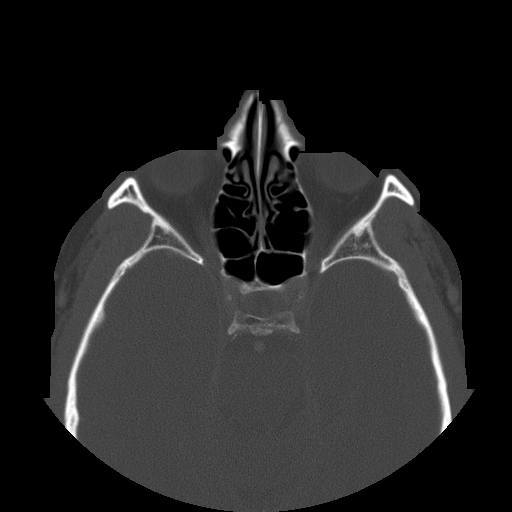
[im 59/71  brain]
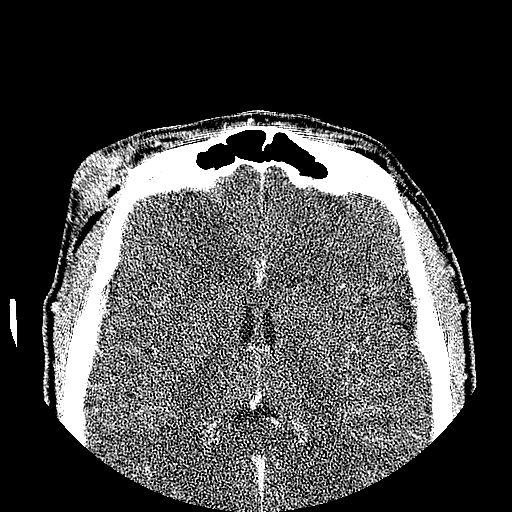
[im 59/71  bone]
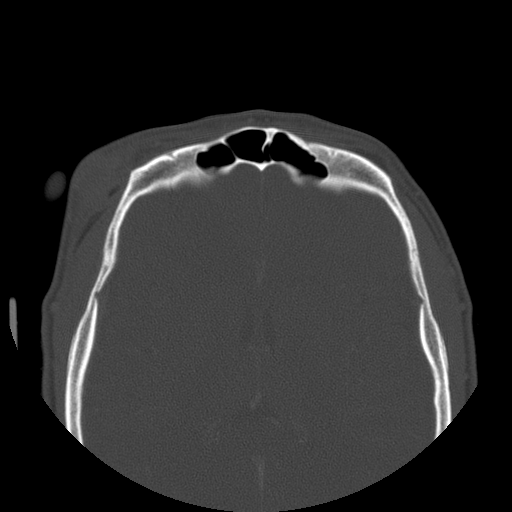

[Series 104: sag soft · sagittal · 0.38mm/px · 7 of 93 slices shown]
[im 12/93  brain]
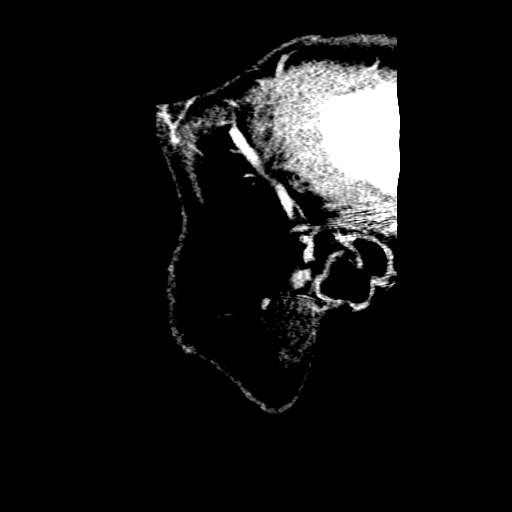
[im 24/93  brain]
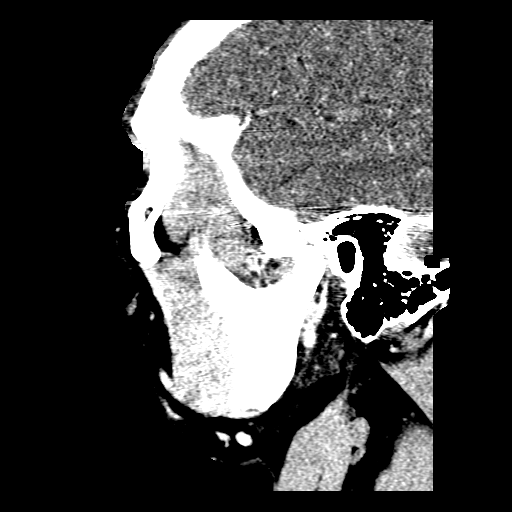
[im 35/93  brain]
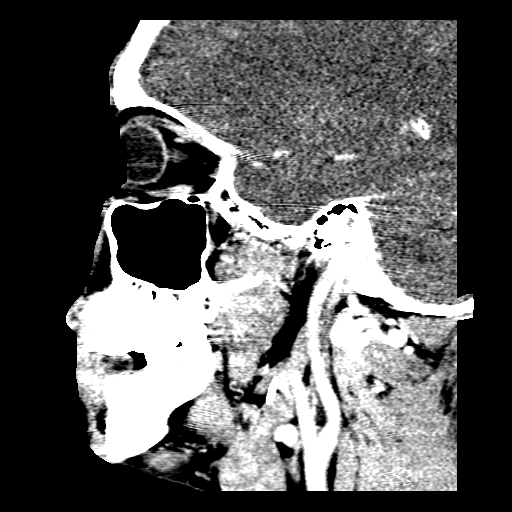
[im 47/93  brain]
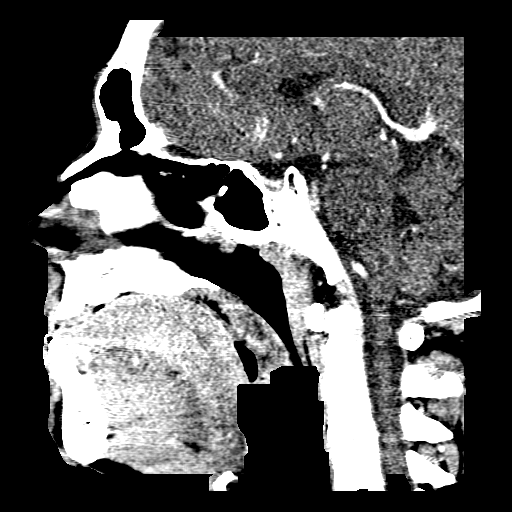
[im 58/93  brain]
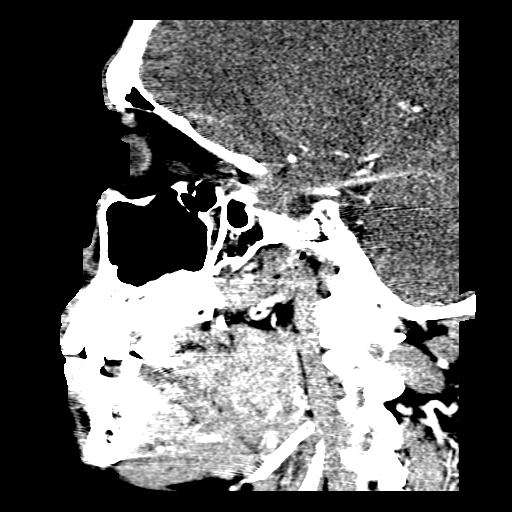
[im 70/93  brain]
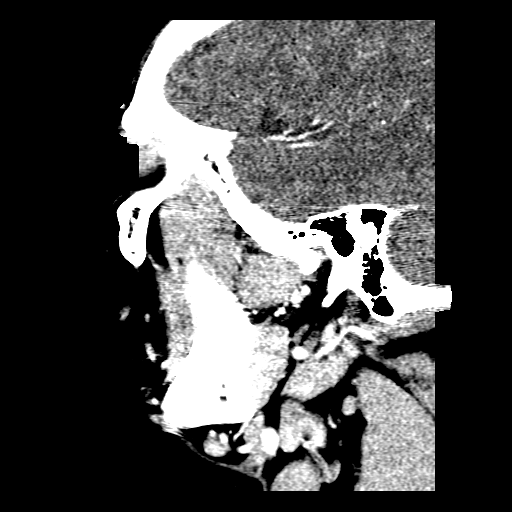
[im 81/93  brain]
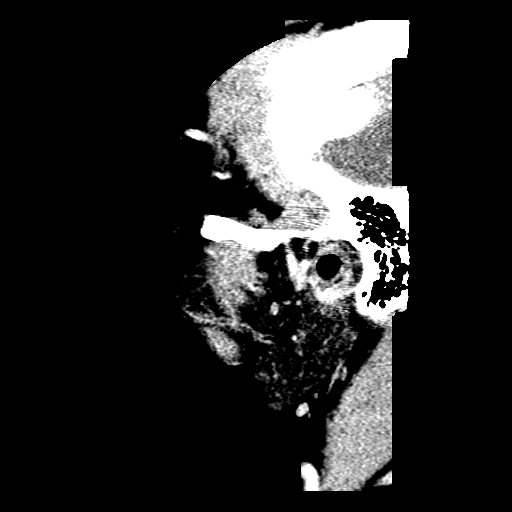

[16 of 30 positions shown; findings below may reference images not displayed]

FINDINGS: The visualized portion of the brain is unremarkable. The globes and
orbital contents appear normal. There is prominent soft tissue
swelling lateral to the right orbit, predominantly superiorly. The
most focal area of confluent inflammation/ enhancement in this
region measures 2.5 x 1.6 cm with some minimally decreased
enhancement centrally but no organized fluid collection. Immediately
deep to this is a subcentimeter focus of fat density, which may
reflect a small amount of residual/ recurrent cyst content. There
may be trace fluid at the inferior aspect of this more cystic area,
which measures 6 x 3 x 17 mm. There is no postseptal orbital
inflammatory change.

No underlying osseous changes are seen. The paranasal sinuses and
visualized mastoid air cells are clear. Parotid and visualized
submandibular glands are unremarkable. Mildly enlarged right level
II lymph node measures 1.5 cm in short axis, most likely reactive.
IMPRESSION: Prominent inflammatory changes in the right lateral supraorbital
region, which may reflect infectious phlegmon. No organized,
drainable fluid collection is yet identified, however there is some
subtle low density within the central portion of the dominant focus
of inflammation as well as a small, more cystic-appearing area
deeper containing fat and fluid, and either of these could reflect
developing abscesses.
